# Patient Record
Sex: Female | Born: 1995 | Race: Black or African American | Hispanic: No | Marital: Single | Smoking: Current every day smoker
Health system: Southern US, Community
[De-identification: ages and names within clinical notes are randomized; demographics above are authoritative.]

---

## 2016-06-02 ENCOUNTER — Emergency Department (HOSPITAL_COMMUNITY): Payer: BLUE CROSS/BLUE SHIELD

## 2016-06-02 ENCOUNTER — Encounter (HOSPITAL_COMMUNITY): Payer: Self-pay | Admitting: Emergency Medicine

## 2016-06-02 ENCOUNTER — Emergency Department (HOSPITAL_COMMUNITY)
Admission: EM | Admit: 2016-06-02 | Discharge: 2016-06-02 | Disposition: A | Discharge status: Home / Self Care | Payer: BLUE CROSS/BLUE SHIELD | Attending: Emergency Medicine | Admitting: Emergency Medicine

## 2016-06-02 DIAGNOSIS — F17200 Nicotine dependence, unspecified, uncomplicated: Secondary | ICD-10-CM | POA: Diagnosis not present

## 2016-06-02 DIAGNOSIS — R109 Unspecified abdominal pain: Secondary | ICD-10-CM

## 2016-06-02 DIAGNOSIS — R1011 Right upper quadrant pain: Secondary | ICD-10-CM | POA: Diagnosis present

## 2016-06-02 DIAGNOSIS — K8020 Calculus of gallbladder without cholecystitis without obstruction: Secondary | ICD-10-CM | POA: Diagnosis not present

## 2016-06-02 HISTORY — DX: Morbid (severe) obesity due to excess calories: E66.01

## 2016-06-02 LAB — CBC WITH DIFFERENTIAL/PLATELET
BASOS ABS: 0 10*3/uL (ref 0.0–0.1)
Basophils Relative: 0 %
EOS PCT: 1 %
Eosinophils Absolute: 0.1 10*3/uL (ref 0.0–0.7)
HCT: 37 % (ref 36.0–46.0)
Hemoglobin: 12.1 g/dL (ref 12.0–15.0)
LYMPHS PCT: 22 %
Lymphs Abs: 1.9 10*3/uL (ref 0.7–4.0)
MCH: 25.4 pg — AB (ref 26.0–34.0)
MCHC: 32.7 g/dL (ref 30.0–36.0)
MCV: 77.6 fL — AB (ref 78.0–100.0)
MONO ABS: 0.6 10*3/uL (ref 0.1–1.0)
MONOS PCT: 7 %
Neutro Abs: 5.9 10*3/uL (ref 1.7–7.7)
Neutrophils Relative %: 70 %
PLATELETS: 316 10*3/uL (ref 150–400)
RBC: 4.77 MIL/uL (ref 3.87–5.11)
RDW: 15.5 % (ref 11.5–15.5)
WBC: 8.4 10*3/uL (ref 4.0–10.5)

## 2016-06-02 LAB — COMPREHENSIVE METABOLIC PANEL
ALBUMIN: 3.6 g/dL (ref 3.5–5.0)
ALK PHOS: 61 U/L (ref 38–126)
ALT: 27 U/L (ref 14–54)
AST: 29 U/L (ref 15–41)
Anion gap: 8 (ref 5–15)
BILIRUBIN TOTAL: 1.2 mg/dL (ref 0.3–1.2)
BUN: 5 mg/dL — AB (ref 6–20)
CO2: 26 mmol/L (ref 22–32)
Calcium: 9.3 mg/dL (ref 8.9–10.3)
Chloride: 104 mmol/L (ref 101–111)
Creatinine, Ser: 0.72 mg/dL (ref 0.44–1.00)
GFR calc Af Amer: >60 mL/min (ref >60)
GFR calc non Af Amer: >60 mL/min (ref >60)
GLUCOSE: 86 mg/dL (ref 65–99)
POTASSIUM: 4 mmol/L (ref 3.5–5.1)
Sodium: 138 mmol/L (ref 135–145)
TOTAL PROTEIN: 7.7 g/dL (ref 6.5–8.1)

## 2016-06-02 LAB — URINALYSIS, ROUTINE W REFLEX MICROSCOPIC
GLUCOSE, UA: NEGATIVE mg/dL
HGB URINE DIPSTICK: NEGATIVE
KETONES UR: 40 mg/dL — AB
Nitrite: NEGATIVE
PROTEIN: 30 mg/dL — AB
Specific Gravity, Urine: 1.027 (ref 1.005–1.030)
pH: 6.5 (ref 5.0–8.0)

## 2016-06-02 LAB — URINE MICROSCOPIC-ADD ON: RBC / HPF: NONE SEEN RBC/hpf (ref 0–5)

## 2016-06-02 LAB — LIPASE, BLOOD: Lipase: 23 U/L (ref 11–51)

## 2016-06-02 MED ORDER — GI COCKTAIL ~~LOC~~
30.0000 mL | Freq: Once | ORAL | Status: AC
  Administered 2016-06-02: 30 mL via ORAL
  Filled 2016-06-02: qty 30

## 2016-06-02 MED ORDER — ESOMEPRAZOLE MAGNESIUM 40 MG PO CPDR: 40.0000 mg | DELAYED_RELEASE_CAPSULE | Freq: Every day | ORAL | 0 refills | Status: AC

## 2016-06-02 MED ORDER — SODIUM CHLORIDE 0.9 % IV BOLUS (SEPSIS)
1000.0000 mL | Freq: Once | INTRAVENOUS | Status: AC
  Administered 2016-06-02: 1000 mL via INTRAVENOUS

## 2016-06-02 MED ORDER — FAMOTIDINE IN NACL 20-0.9 MG/50ML-% IV SOLN
20.0000 mg | INTRAVENOUS | Status: AC
  Administered 2016-06-02: 20 mg via INTRAVENOUS
  Filled 2016-06-02: qty 50

## 2016-06-02 MED ORDER — ONDANSETRON HCL 4 MG/2ML IJ SOLN
4.0000 mg | Freq: Once | INTRAMUSCULAR | Status: AC
  Administered 2016-06-02: 4 mg via INTRAVENOUS
  Filled 2016-06-02: qty 2

## 2016-06-02 MED ORDER — FAMOTIDINE 20 MG PO TABS: 20.0000 mg | ORAL_TABLET | Freq: Two times a day (BID) | ORAL | 0 refills | Status: AC

## 2016-06-02 NOTE — ED Triage Notes (Signed)
abd pain since sat  vomited x 2 LMP   Oct 11, urine has been darker , denies preg

## 2016-06-02 NOTE — ED Notes (Signed)
Pt ambulated to room from waiting room, tolerated well. Pt unable to void at this time. Pt placed in gown and on monitor.

## 2016-06-02 NOTE — ED Provider Notes (Signed)
[facility]-EMERGENCY DEPT Provider Note   CSN: [id number] Arrival date & time: 06/02/16  [id number]     History   Chief Complaint Chief Complaint  Patient presents with  . Abdominal Pain    HPI Brittney Hill is a 20 y.o. female.  The patient is a 20 year old female, she is morbidly obese, she is a [employer] student, she states that after drinking alcohol on Thursday night she started to have epigastric and right upper quadrant pain which has been persistent since that time, gradually worsening and has now stabilized in intensity, she has had nausea but no vomiting, she has not had very much to eat or drink in 3 days because of the abdominal discomfort. The discomfort radiates to her back, she denies swelling of the legs, diarrhea, dysuria though she has noticed darkened urine over the last couple of days that she has not been drinking any fluids. She denies any prior history of abdominal surgery, she does not drink alcohol frequently, she does not use other drugs other than occasional marijuana, she has never had pancreatitis or abdominal surgery    Abdominal Pain      Past Medical History:  Diagnosis Date  . Morbid obesity (HCC)     There are no active problems to display for this patient.   History reviewed. No pertinent surgical history.  OB History    No data available       Home Medications    Prior to Admission medications   Not on File    Family History No family history on file.  Social History Social History  Substance Use Topics  . Smoking status: Current Every Day Smoker  . Smokeless tobacco: Former NeurosurgeonUser  . Alcohol use Not on file     Allergies   Review of patient's allergies indicates no known allergies.   Review of Systems Review of Systems  Gastrointestinal: Positive for abdominal pain.  All other systems reviewed and are negative.    Physical Exam Updated Vital Signs BP 104/73   Pulse 67   Temp 98.9 F (37.2 C)   Resp 17   SpO2 100%    Physical Exam  Constitutional: She appears well-developed and well-nourished. No distress.  HENT:  Head: Normocephalic and atraumatic.  Mouth/Throat: Oropharynx is clear and moist. No oropharyngeal exudate.  Eyes: Conjunctivae and EOM are normal. Pupils are equal, round, and reactive to light. Right eye exhibits no discharge. Left eye exhibits no discharge. No scleral icterus.  Neck: Normal range of motion. Neck supple. No JVD present. No thyromegaly present.  Cardiovascular: Normal rate, regular rhythm, normal heart sounds and intact distal pulses.  Exam reveals no gallop and no friction rub.   No murmur heard. Pulmonary/Chest: Effort normal and breath sounds normal. No respiratory distress. She has no wheezes. She has no rales.  Abdominal: Soft. Bowel sounds are normal. She exhibits no distension and no mass. There is tenderness ( RUQ and epigastric ttp, no guarding, no murphys).  Musculoskeletal: Normal range of motion. She exhibits no edema or tenderness.  Lymphadenopathy:    She has no cervical adenopathy.  Neurological: She is alert. Coordination normal.  Skin: Skin is warm and dry. No rash noted. No erythema.  Psychiatric: She has a normal mood and affect. Her behavior is normal.  Nursing note and vitals reviewed.    ED Treatments / Results  Labs (all labs ordered are listed, but only abnormal results are displayed) Labs Reviewed  COMPREHENSIVE METABOLIC PANEL - Abnormal; Notable for the following:  Result Value   BUN 5 (*)    All other components within normal limits  CBC WITH DIFFERENTIAL/PLATELET - Abnormal; Notable for the following:    MCV 77.6 (*)    MCH 25.4 (*)    All other components within normal limits  URINALYSIS, ROUTINE W REFLEX MICROSCOPIC (NOT AT [facility]) - Abnormal; Notable for the following:    Color, Urine AMBER (*)    APPearance CLOUDY (*)    Bilirubin Urine MODERATE (*)    Ketones, ur 40 (*)    Protein, ur 30 (*)    Leukocytes, UA MODERATE (*)     All other components within normal limits  URINE MICROSCOPIC-ADD ON - Abnormal; Notable for the following:    Squamous Epithelial / LPF 6-30 (*)    Bacteria, UA MANY (*)    All other components within normal limits  LIPASE, BLOOD    Radiology US Abdomen Limited Ruq  Result Date: 06/02/2016 CLINICAL DATA:  Right upper quadrant pain for 4 days EXAM: US ABDOMEN LIMITED - RIGHT UPPER QUADRANT COMPARISON:  None. FINDINGS: Gallbladder: A few small layering stones, the largest measuring 11 mm. No wall thickening. Negative sonographic Murphy's. Common bile duct: Diameter: Normal caliber, 5 mm Liver: No focal lesion identified. Within normal limits in parenchymal echogenicity. IMPRESSION: Cholelithiasis.  No sonographic evidence of acute cholecystitis. Electronically Signed   By: Charlett Nose M.D.   On: 06/02/2016 12:40    Procedures Procedures (including critical care time)  Medications Ordered in ED Medications  sodium chloride 0.9 % bolus 1,000 mL (0 mLs Intravenous Stopped 06/02/16 1251)  ondansetron (ZOFRAN) injection 4 mg (4 mg Intravenous Given 06/02/16 1059)  gi cocktail (Maalox,Lidocaine,Donnatal) (30 mLs Oral Given 06/02/16 1323)  famotidine (PEPCID) IVPB 20 mg premix (0 mg Intravenous Stopped 06/02/16 1417)    Initial Impression / Assessment and Plan / ED Course  I have reviewed the triage vital signs and the nursing notes.  Pertinent labs & imaging results that were available during my care of the patient were reviewed by me and considered in my medical decision making (see chart for details).  Clinical Course   Possible PUD, possible chole / lipase Labs negative [place] without cholecystitis Had a long discussion with patient re: possible sources Needs f/u - will start Pepcid and Nexium, pt in agreement councilled against ETOH use / NSAIDs Pt agreeable.  Final Clinical Impressions(s) / ED Diagnoses   Final diagnoses:  Abdominal pain, unspecified abdominal location     New Prescriptions New Prescriptions   No medications on file     Eber Hong, MD 06/02/16 [phone]

## 2016-06-02 NOTE — Discharge Instructions (Signed)
Your testing has not shown any specific findings You likely have a stomach ulcer - please see the attached reading instructions regarding food to eat and foods to avoid Pepcid and Nexium daily for next 2 weeks See your doctor in follow up for recheck in next 2 weeks If your symptoms worsen, return to the ER immediately.

## 2017-07-24 IMAGING — US US ABDOMEN LIMITED
1 series · 14 of 25 positions shown · non-contrast
Comparison: None.

CLINICAL DATA: Right upper quadrant pain for 4 days

EXAM:
US ABDOMEN LIMITED - RIGHT UPPER QUADRANT

[Series 1: us abdomen limited · 0.26mm/px · 14 of 61 slices shown]
[im 1/61]
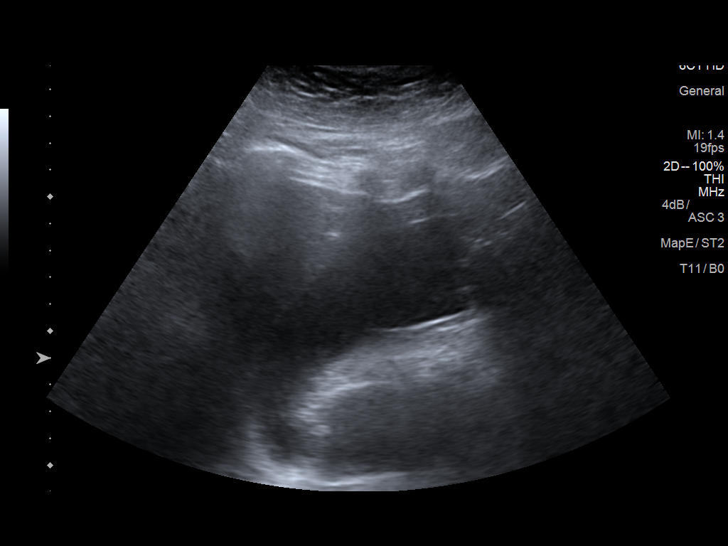
[im 6/61]
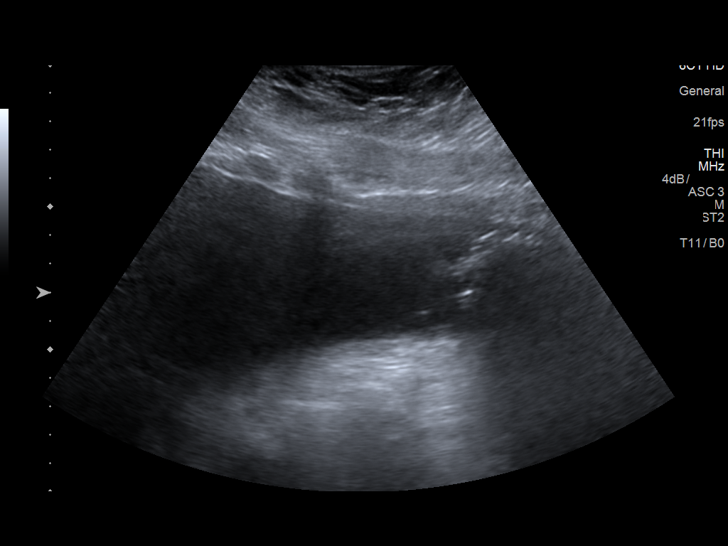
[im 11/61]
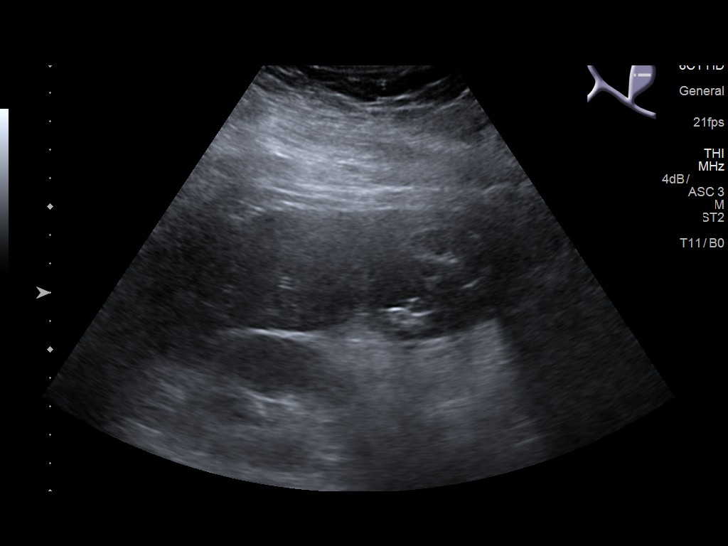
[im 16/61]
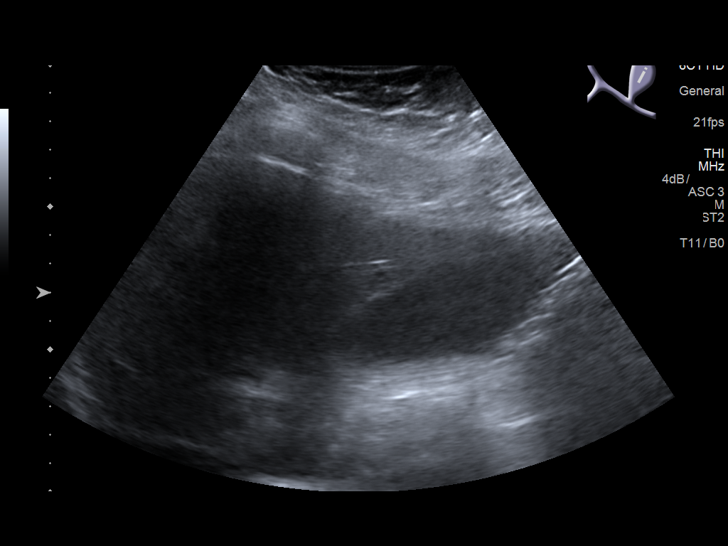
[im 21/61]
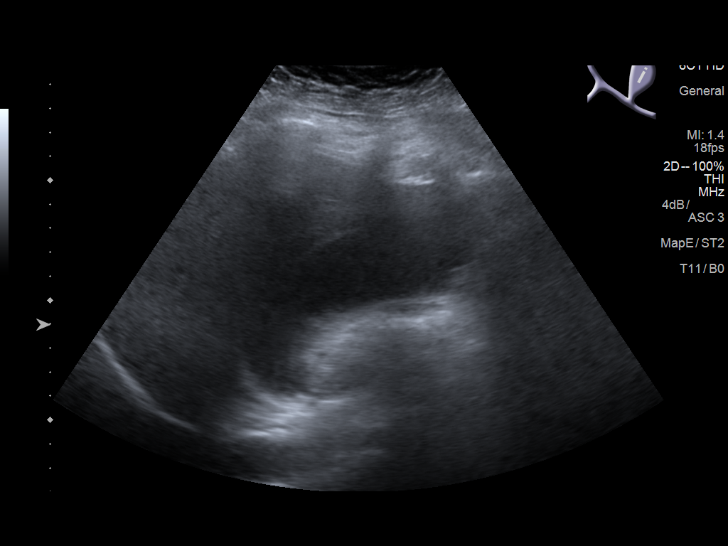
[im 23/61]
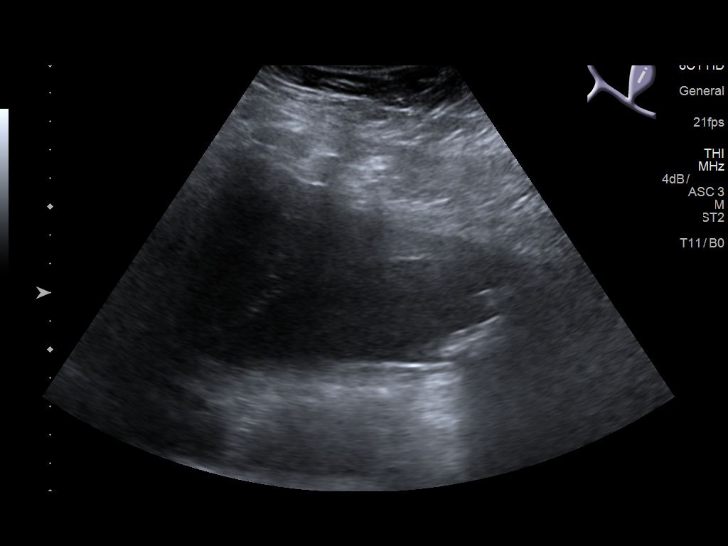
[im 28/61]
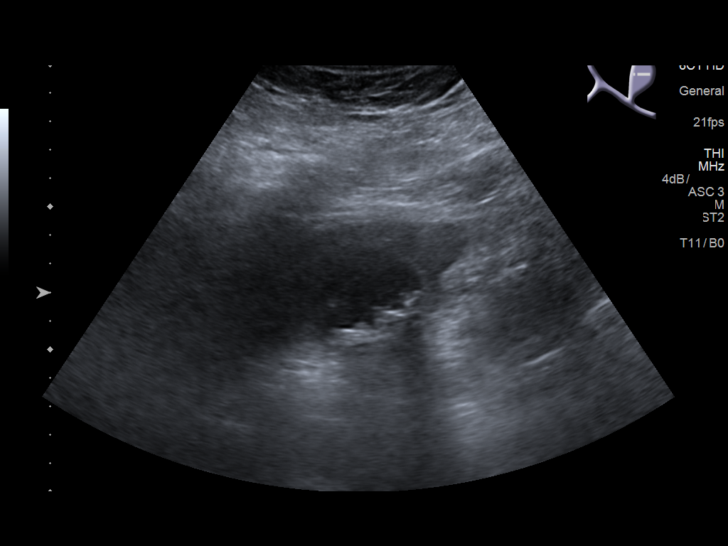
[im 33/61]
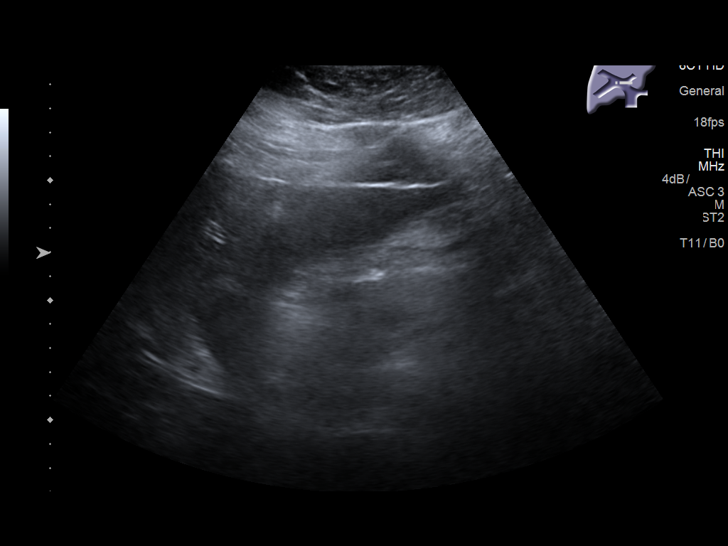
[im 38/61]
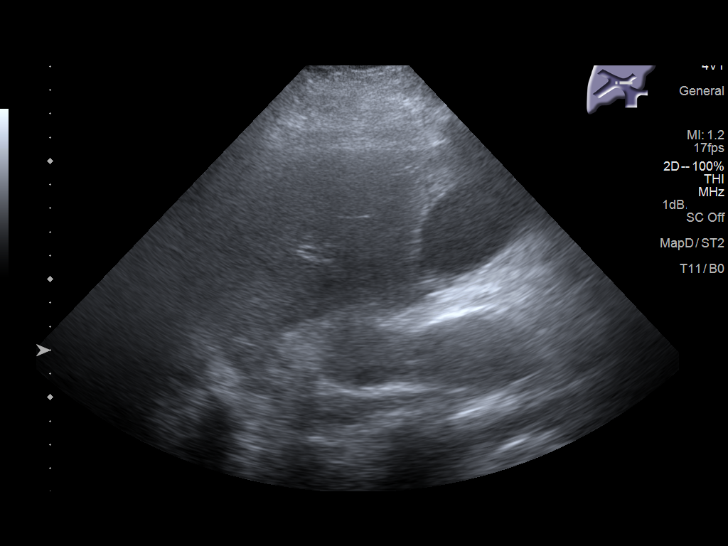
[im 41/61]
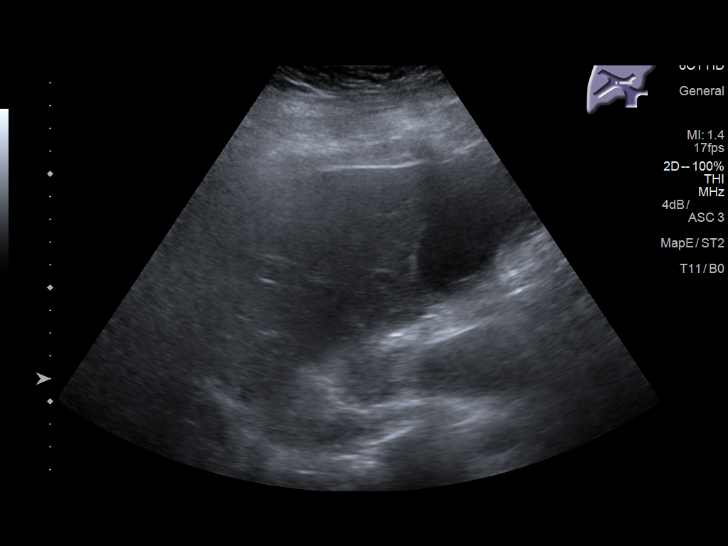
[im 46/61]
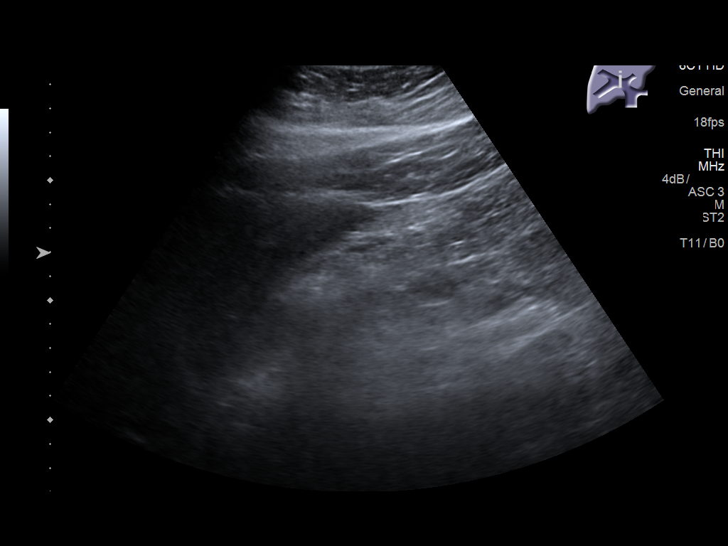
[im 51/61]
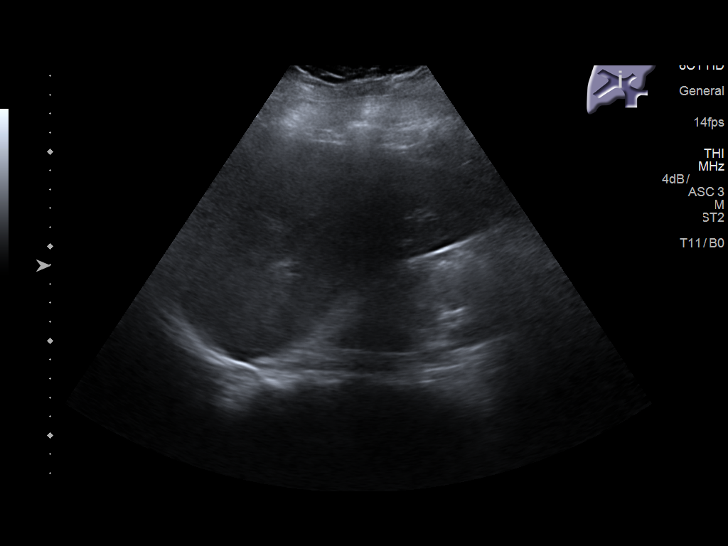
[im 56/61]
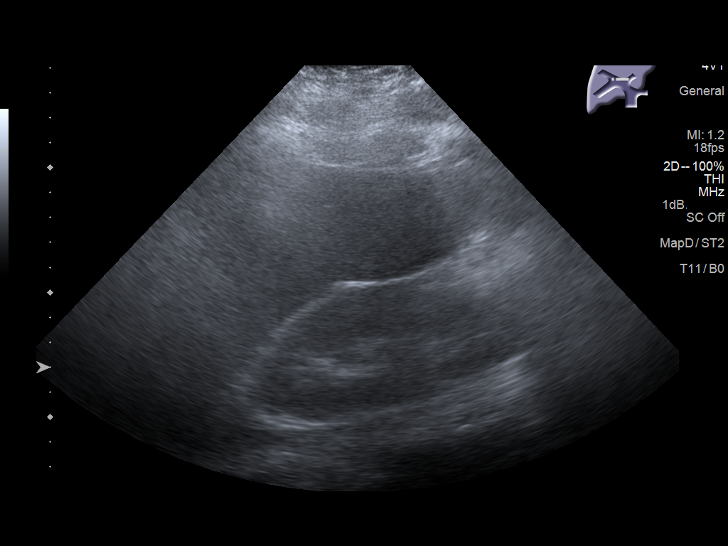
[im 61/61]
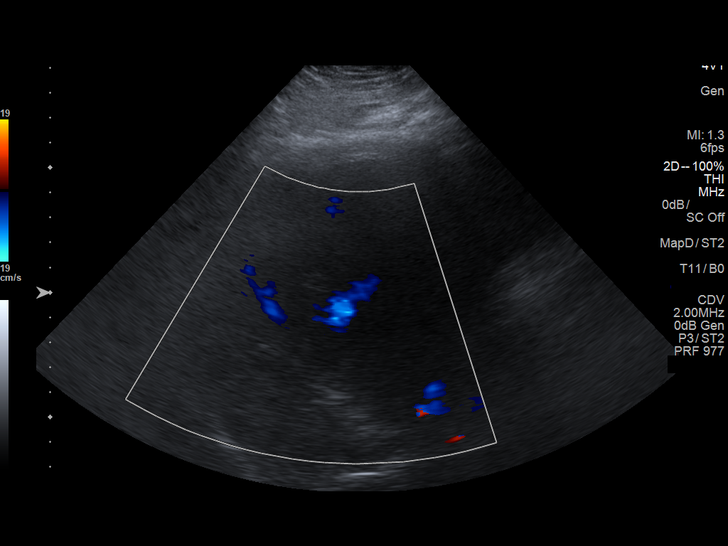

[14 of 25 positions shown; findings below may reference images not displayed]

FINDINGS: Gallbladder:

A few small layering stones, the largest measuring 11 mm. No wall
thickening. Negative sonographic Karset.

Common bile duct:

Diameter: Normal caliber, 5 mm

Liver:

No focal lesion identified. Within normal limits in parenchymal
echogenicity.
IMPRESSION: Cholelithiasis.  No sonographic evidence of acute cholecystitis.

## 2023-06-12 DIAGNOSIS — Z419 Encounter for procedure for purposes other than remedying health state, unspecified: Secondary | ICD-10-CM | POA: Diagnosis not present

## 2023-07-12 DIAGNOSIS — Z419 Encounter for procedure for purposes other than remedying health state, unspecified: Secondary | ICD-10-CM | POA: Diagnosis not present

## 2023-08-12 DIAGNOSIS — Z419 Encounter for procedure for purposes other than remedying health state, unspecified: Secondary | ICD-10-CM | POA: Diagnosis not present

## 2023-09-12 DIAGNOSIS — Z419 Encounter for procedure for purposes other than remedying health state, unspecified: Secondary | ICD-10-CM | POA: Diagnosis not present

## 2023-10-10 DIAGNOSIS — Z419 Encounter for procedure for purposes other than remedying health state, unspecified: Secondary | ICD-10-CM | POA: Diagnosis not present

## 2023-10-15 DIAGNOSIS — N938 Other specified abnormal uterine and vaginal bleeding: Secondary | ICD-10-CM | POA: Diagnosis not present

## 2023-11-21 DIAGNOSIS — Z419 Encounter for procedure for purposes other than remedying health state, unspecified: Secondary | ICD-10-CM | POA: Diagnosis not present

## 2023-12-21 DIAGNOSIS — Z419 Encounter for procedure for purposes other than remedying health state, unspecified: Secondary | ICD-10-CM | POA: Diagnosis not present

## 2024-01-21 DIAGNOSIS — Z419 Encounter for procedure for purposes other than remedying health state, unspecified: Secondary | ICD-10-CM | POA: Diagnosis not present

## 2024-02-20 DIAGNOSIS — Z419 Encounter for procedure for purposes other than remedying health state, unspecified: Secondary | ICD-10-CM | POA: Diagnosis not present

## 2024-03-22 DIAGNOSIS — Z419 Encounter for procedure for purposes other than remedying health state, unspecified: Secondary | ICD-10-CM | POA: Diagnosis not present

## 2024-04-22 DIAGNOSIS — Z419 Encounter for procedure for purposes other than remedying health state, unspecified: Secondary | ICD-10-CM | POA: Diagnosis not present
